# Patient Record
Sex: Male | Born: 1955 | Race: White | Hispanic: No | Marital: Single | Smoking: Former smoker
Health system: Southern US, Community
[De-identification: ages and names within clinical notes are randomized; demographics above are authoritative.]

## PROBLEM LIST (undated history)

## (undated) DIAGNOSIS — M1990 Unspecified osteoarthritis, unspecified site: Secondary | ICD-10-CM

## (undated) DIAGNOSIS — J449 Chronic obstructive pulmonary disease, unspecified: Secondary | ICD-10-CM

## (undated) DIAGNOSIS — M954 Acquired deformity of chest and rib: Secondary | ICD-10-CM

## (undated) HISTORY — PX: FRACTURE SURGERY: SHX138

---

## 2003-06-02 ENCOUNTER — Emergency Department (HOSPITAL_COMMUNITY): Admission: EM | Admit: 2003-06-02 | Discharge: 2003-06-02 | Payer: Self-pay | Admitting: Emergency Medicine

## 2013-01-27 ENCOUNTER — Emergency Department (HOSPITAL_COMMUNITY)
Admission: EM | Admit: 2013-01-27 | Discharge: 2013-01-27 | Disposition: A | Discharge status: Home / Self Care | Payer: Worker's Compensation | Attending: Emergency Medicine | Admitting: Emergency Medicine

## 2013-01-27 ENCOUNTER — Encounter (HOSPITAL_COMMUNITY): Payer: Self-pay

## 2013-01-27 DIAGNOSIS — S0100XA Unspecified open wound of scalp, initial encounter: Secondary | ICD-10-CM | POA: Insufficient documentation

## 2013-01-27 DIAGNOSIS — Z23 Encounter for immunization: Secondary | ICD-10-CM | POA: Insufficient documentation

## 2013-01-27 DIAGNOSIS — F17200 Nicotine dependence, unspecified, uncomplicated: Secondary | ICD-10-CM | POA: Insufficient documentation

## 2013-01-27 DIAGNOSIS — Y9389 Activity, other specified: Secondary | ICD-10-CM | POA: Insufficient documentation

## 2013-01-27 DIAGNOSIS — W11XXXA Fall on and from ladder, initial encounter: Secondary | ICD-10-CM | POA: Insufficient documentation

## 2013-01-27 DIAGNOSIS — S0101XA Laceration without foreign body of scalp, initial encounter: Secondary | ICD-10-CM

## 2013-01-27 DIAGNOSIS — W1809XA Striking against other object with subsequent fall, initial encounter: Secondary | ICD-10-CM | POA: Insufficient documentation

## 2013-01-27 DIAGNOSIS — Y929 Unspecified place or not applicable: Secondary | ICD-10-CM | POA: Insufficient documentation

## 2013-01-27 MED ORDER — TETANUS-DIPHTH-ACELL PERTUSSIS 5-2.5-18.5 LF-MCG/0.5 IM SUSP
0.5000 mL | Freq: Once | INTRAMUSCULAR | Status: AC
  Administered 2013-01-27: 0.5 mL via INTRAMUSCULAR
  Filled 2013-01-27: qty 0.5

## 2013-01-27 MED ORDER — LIDOCAINE-EPINEPHRINE (PF) 1 %-1:200000 IJ SOLN
10.0000 mL | Freq: Once | INTRAMUSCULAR | Status: AC
  Administered 2013-01-27: 10 mL
  Filled 2013-01-27: qty 10

## 2013-01-27 NOTE — ED Notes (Signed)
Clarified with pt. Pt denies LOC but reports "everything went black for a few seconds." EDP reported did not feel the need for CT at this time. Pt and Pt family aware and verbalized understanding.
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
## 2013-01-27 NOTE — ED Notes (Signed)
Pt reports falling backwards off a 6 foot ladder, +headache, no nausea or vomiting. +loc.  Was sent by urgent care. Pt alert in triage.

## 2016-11-24 ENCOUNTER — Emergency Department (HOSPITAL_COMMUNITY): Payer: No Typology Code available for payment source

## 2016-11-24 ENCOUNTER — Emergency Department (HOSPITAL_COMMUNITY)
Admission: EM | Admit: 2016-11-24 | Discharge: 2016-11-25 | Disposition: A | Discharge status: Home / Self Care | Payer: No Typology Code available for payment source | Attending: Emergency Medicine | Admitting: Emergency Medicine

## 2016-11-24 ENCOUNTER — Encounter (HOSPITAL_COMMUNITY): Payer: Self-pay | Admitting: *Deleted

## 2016-11-24 DIAGNOSIS — S62626B Displaced fracture of medial phalanx of right little finger, initial encounter for open fracture: Secondary | ICD-10-CM | POA: Insufficient documentation

## 2016-11-24 DIAGNOSIS — S51811A Laceration without foreign body of right forearm, initial encounter: Secondary | ICD-10-CM | POA: Diagnosis not present

## 2016-11-24 DIAGNOSIS — Y92410 Unspecified street and highway as the place of occurrence of the external cause: Secondary | ICD-10-CM | POA: Diagnosis not present

## 2016-11-24 DIAGNOSIS — S59911A Unspecified injury of right forearm, initial encounter: Secondary | ICD-10-CM | POA: Diagnosis present

## 2016-11-24 DIAGNOSIS — S61210A Laceration without foreign body of right index finger without damage to nail, initial encounter: Secondary | ICD-10-CM | POA: Insufficient documentation

## 2016-11-24 DIAGNOSIS — Y999 Unspecified external cause status: Secondary | ICD-10-CM | POA: Diagnosis not present

## 2016-11-24 DIAGNOSIS — Z87891 Personal history of nicotine dependence: Secondary | ICD-10-CM | POA: Diagnosis not present

## 2016-11-24 DIAGNOSIS — S0031XA Abrasion of nose, initial encounter: Secondary | ICD-10-CM | POA: Insufficient documentation

## 2016-11-24 DIAGNOSIS — S61216A Laceration without foreign body of right little finger without damage to nail, initial encounter: Secondary | ICD-10-CM | POA: Insufficient documentation

## 2016-11-24 DIAGNOSIS — Z23 Encounter for immunization: Secondary | ICD-10-CM | POA: Insufficient documentation

## 2016-11-24 DIAGNOSIS — S61411A Laceration without foreign body of right hand, initial encounter: Secondary | ICD-10-CM

## 2016-11-24 DIAGNOSIS — J449 Chronic obstructive pulmonary disease, unspecified: Secondary | ICD-10-CM | POA: Diagnosis not present

## 2016-11-24 DIAGNOSIS — Y939 Activity, unspecified: Secondary | ICD-10-CM | POA: Insufficient documentation

## 2016-11-24 DIAGNOSIS — R51 Headache: Secondary | ICD-10-CM | POA: Diagnosis not present

## 2016-11-24 DIAGNOSIS — S61219A Laceration without foreign body of unspecified finger without damage to nail, initial encounter: Secondary | ICD-10-CM

## 2016-11-24 HISTORY — DX: Unspecified osteoarthritis, unspecified site: M19.90

## 2016-11-24 HISTORY — DX: Acquired deformity of chest and rib: M95.4

## 2016-11-24 HISTORY — DX: Chronic obstructive pulmonary disease, unspecified: J44.9

## 2016-11-24 LAB — GLUCOSE, CAPILLARY: GLUCOSE-CAPILLARY: 95 mg/dL (ref 65–99)

## 2016-11-24 MED ORDER — ONDANSETRON HCL 4 MG PO TABS
4.0000 mg | ORAL_TABLET | Freq: Once | ORAL | Status: AC
  Administered 2016-11-25: 4 mg via ORAL
  Filled 2016-11-24: qty 1

## 2016-11-24 MED ORDER — HYDROCODONE-ACETAMINOPHEN 5-325 MG PO TABS
2.0000 | ORAL_TABLET | Freq: Once | ORAL | Status: AC
  Administered 2016-11-25: 2 via ORAL
  Filled 2016-11-24: qty 2

## 2016-11-24 MED ORDER — POVIDONE-IODINE 10 % EX SOLN
CUTANEOUS | Status: AC
  Filled 2016-11-24: qty 45

## 2016-11-24 MED ORDER — DOXYCYCLINE HYCLATE 100 MG PO TABS
100.0000 mg | ORAL_TABLET | Freq: Once | ORAL | Status: AC
  Administered 2016-11-25: 100 mg via ORAL
  Filled 2016-11-24: qty 1

## 2016-11-24 MED ORDER — BUPIVACAINE HCL (PF) 0.25 % IJ SOLN
30.0000 mL | Freq: Once | INTRAMUSCULAR | Status: DC
  Filled 2016-11-24: qty 30

## 2016-11-24 MED ORDER — TETANUS-DIPHTH-ACELL PERTUSSIS 5-2.5-18.5 LF-MCG/0.5 IM SUSP
0.5000 mL | Freq: Once | INTRAMUSCULAR | Status: AC
  Administered 2016-11-24: 0.5 mL via INTRAMUSCULAR
  Filled 2016-11-24: qty 0.5

## 2016-11-24 MED ORDER — LORAZEPAM 1 MG PO TABS
1.0000 mg | ORAL_TABLET | Freq: Once | ORAL | Status: AC
  Administered 2016-11-24: 1 mg via ORAL
  Filled 2016-11-24: qty 1

## 2016-11-24 NOTE — ED Notes (Signed)
abd pad and guaze dressing applied to R forearm, no active bleeding noted from large laceration. Gauze dressing applied to R pinky finger, oozing noted to laceration. Pt told to notify staff if bleeding through dressing.

## 2016-11-24 NOTE — ED Notes (Signed)
Pt to CT and chest xray
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
## 2016-11-25 MED ORDER — HYDROCODONE-ACETAMINOPHEN 5-325 MG PO TABS: 1.0000 | ORAL_TABLET | ORAL | 0 refills | Status: DC | PRN

## 2016-11-25 MED ORDER — CIPROFLOXACIN HCL 500 MG PO TABS: 500.0000 mg | ORAL_TABLET | Freq: Two times a day (BID) | ORAL | 0 refills | Status: DC
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
## 2016-12-05 ENCOUNTER — Inpatient Hospital Stay (HOSPITAL_COMMUNITY)
Admission: EM | Admit: 2016-12-05 | Discharge: 2016-12-07 | DRG: 189 | Disposition: A | Discharge status: Home / Self Care | Payer: Self-pay | Attending: Internal Medicine | Admitting: Internal Medicine

## 2016-12-05 ENCOUNTER — Encounter (HOSPITAL_COMMUNITY): Payer: Self-pay

## 2016-12-05 ENCOUNTER — Emergency Department (HOSPITAL_COMMUNITY): Payer: Self-pay

## 2016-12-05 ENCOUNTER — Encounter (HOSPITAL_COMMUNITY): Payer: Self-pay | Admitting: Emergency Medicine

## 2016-12-05 ENCOUNTER — Emergency Department (HOSPITAL_COMMUNITY)
Admission: EM | Admit: 2016-12-05 | Discharge: 2016-12-05 | Disposition: A | Discharge status: Home / Self Care | Payer: Self-pay | Source: Home / Self Care | Attending: Emergency Medicine | Admitting: Emergency Medicine

## 2016-12-05 DIAGNOSIS — R600 Localized edema: Secondary | ICD-10-CM

## 2016-12-05 DIAGNOSIS — S61214D Laceration without foreign body of right ring finger without damage to nail, subsequent encounter: Secondary | ICD-10-CM | POA: Insufficient documentation

## 2016-12-05 DIAGNOSIS — R0902 Hypoxemia: Secondary | ICD-10-CM

## 2016-12-05 DIAGNOSIS — S61216D Laceration without foreign body of right little finger without damage to nail, subsequent encounter: Secondary | ICD-10-CM | POA: Insufficient documentation

## 2016-12-05 DIAGNOSIS — J189 Pneumonia, unspecified organism: Secondary | ICD-10-CM | POA: Diagnosis present

## 2016-12-05 DIAGNOSIS — Y92410 Unspecified street and highway as the place of occurrence of the external cause: Secondary | ICD-10-CM

## 2016-12-05 DIAGNOSIS — J441 Chronic obstructive pulmonary disease with (acute) exacerbation: Secondary | ICD-10-CM | POA: Diagnosis present

## 2016-12-05 DIAGNOSIS — Y906 Blood alcohol level of 120-199 mg/100 ml: Secondary | ICD-10-CM | POA: Diagnosis present

## 2016-12-05 DIAGNOSIS — F10129 Alcohol abuse with intoxication, unspecified: Secondary | ICD-10-CM | POA: Diagnosis present

## 2016-12-05 DIAGNOSIS — J690 Pneumonitis due to inhalation of food and vomit: Secondary | ICD-10-CM | POA: Diagnosis present

## 2016-12-05 DIAGNOSIS — Z4802 Encounter for removal of sutures: Secondary | ICD-10-CM

## 2016-12-05 DIAGNOSIS — S51811D Laceration without foreign body of right forearm, subsequent encounter: Secondary | ICD-10-CM | POA: Insufficient documentation

## 2016-12-05 DIAGNOSIS — Z87891 Personal history of nicotine dependence: Secondary | ICD-10-CM | POA: Insufficient documentation

## 2016-12-05 DIAGNOSIS — R918 Other nonspecific abnormal finding of lung field: Secondary | ICD-10-CM | POA: Diagnosis present

## 2016-12-05 DIAGNOSIS — J9602 Acute respiratory failure with hypercapnia: Secondary | ICD-10-CM | POA: Diagnosis present

## 2016-12-05 DIAGNOSIS — J9601 Acute respiratory failure with hypoxia: Secondary | ICD-10-CM

## 2016-12-05 DIAGNOSIS — S2232XA Fracture of one rib, left side, initial encounter for closed fracture: Secondary | ICD-10-CM | POA: Diagnosis present

## 2016-12-05 DIAGNOSIS — J449 Chronic obstructive pulmonary disease, unspecified: Secondary | ICD-10-CM | POA: Insufficient documentation

## 2016-12-05 DIAGNOSIS — J9622 Acute and chronic respiratory failure with hypercapnia: Principal | ICD-10-CM | POA: Diagnosis present

## 2016-12-05 DIAGNOSIS — J9621 Acute and chronic respiratory failure with hypoxia: Secondary | ICD-10-CM | POA: Diagnosis present

## 2016-12-05 DIAGNOSIS — R911 Solitary pulmonary nodule: Secondary | ICD-10-CM | POA: Diagnosis present

## 2016-12-05 MED ORDER — PREDNISONE 50 MG PO TABS
60.0000 mg | ORAL_TABLET | Freq: Once | ORAL | Status: AC
  Administered 2016-12-06: 60 mg via ORAL
  Filled 2016-12-05: qty 1

## 2016-12-05 MED ORDER — ALBUTEROL (5 MG/ML) CONTINUOUS INHALATION SOLN
10.0000 mg/h | INHALATION_SOLUTION | Freq: Once | RESPIRATORY_TRACT | Status: AC
  Administered 2016-12-05: 10 mg/h via RESPIRATORY_TRACT
  Filled 2016-12-05: qty 20

## 2016-12-05 MED ORDER — ALBUTEROL (5 MG/ML) CONTINUOUS INHALATION SOLN
10.0000 mg/h | INHALATION_SOLUTION | Freq: Once | RESPIRATORY_TRACT | Status: AC
  Administered 2016-12-06: 10 mg/h via RESPIRATORY_TRACT
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
## 2016-12-05 NOTE — ED Triage Notes (Signed)
Rt forearm staple removal

## 2016-12-05 NOTE — Discharge Instructions (Signed)
Keep the finger splinted.  You can clean the wounds with mild soap and water.  Call Dr. Mort SawyersHarrison's office to arrange a follow-up

## 2016-12-06 ENCOUNTER — Emergency Department (HOSPITAL_COMMUNITY): Payer: Self-pay

## 2016-12-06 ENCOUNTER — Encounter (HOSPITAL_COMMUNITY): Payer: Self-pay | Admitting: Family Medicine

## 2016-12-06 DIAGNOSIS — F10129 Alcohol abuse with intoxication, unspecified: Secondary | ICD-10-CM | POA: Diagnosis present

## 2016-12-06 DIAGNOSIS — J189 Pneumonia, unspecified organism: Secondary | ICD-10-CM

## 2016-12-06 DIAGNOSIS — R918 Other nonspecific abnormal finding of lung field: Secondary | ICD-10-CM | POA: Diagnosis present

## 2016-12-06 DIAGNOSIS — J9601 Acute respiratory failure with hypoxia: Secondary | ICD-10-CM | POA: Insufficient documentation

## 2016-12-06 DIAGNOSIS — J441 Chronic obstructive pulmonary disease with (acute) exacerbation: Secondary | ICD-10-CM | POA: Diagnosis present

## 2016-12-06 DIAGNOSIS — J9602 Acute respiratory failure with hypercapnia: Secondary | ICD-10-CM | POA: Diagnosis present

## 2016-12-06 LAB — BLOOD GAS, ARTERIAL
Acid-Base Excess: 2.6 mmol/L — ABNORMAL HIGH (ref 0.0–2.0)
Acid-Base Excess: 5 mmol/L — ABNORMAL HIGH (ref 0.0–2.0)
BICARBONATE: 28 mmol/L (ref 20.0–28.0)
Bicarbonate: 24.8 mmol/L (ref 20.0–28.0)
DELIVERY SYSTEMS: POSITIVE
DRAWN BY: 382351
DRAWN BY: 28459
EXPIRATORY PAP: 6
FIO2: 100
FIO2: 45
Inspiratory PAP: 12
O2 Saturation: 98.5 %
O2 Saturation: 97.9 %
PATIENT TEMPERATURE: 37
PATIENT TEMPERATURE: 37
PH ART: 7.219 — AB (ref 7.350–7.450)
PO2 ART: 174 mmHg — AB (ref 83.0–108.0)
RATE: 8 resp/min
pCO2 arterial: 75.5 mmHg (ref 32.0–48.0)
pCO2 arterial: 53.7 mmHg — ABNORMAL HIGH (ref 32.0–48.0)
pH, Arterial: 7.367 (ref 7.350–7.450)
pO2, Arterial: 118 mmHg — ABNORMAL HIGH (ref 83.0–108.0)

## 2016-12-06 LAB — CBC WITH DIFFERENTIAL/PLATELET
BASOS ABS: 0.1 10*3/uL (ref 0.0–0.1)
Basophils Absolute: 0 10*3/uL (ref 0.0–0.1)
Basophils Relative: 0 %
Basophils Relative: 1 %
EOS PCT: 0 %
Eosinophils Absolute: 0 10*3/uL (ref 0.0–0.7)
Eosinophils Absolute: 0.6 10*3/uL (ref 0.0–0.7)
Eosinophils Relative: 7 %
HCT: 39 % (ref 39.0–52.0)
HEMATOCRIT: 39.7 % (ref 39.0–52.0)
HEMOGLOBIN: 12.9 g/dL — AB (ref 13.0–17.0)
Hemoglobin: 12.7 g/dL — ABNORMAL LOW (ref 13.0–17.0)
LYMPHS ABS: 3.1 10*3/uL (ref 0.7–4.0)
LYMPHS PCT: 32 %
Lymphocytes Relative: 5 %
Lymphs Abs: 0.8 10*3/uL (ref 0.7–4.0)
MCH: 29.8 pg (ref 26.0–34.0)
MCH: 30.6 pg (ref 26.0–34.0)
MCHC: 32 g/dL (ref 30.0–36.0)
MCHC: 33.1 g/dL (ref 30.0–36.0)
MCV: 92.6 fL (ref 78.0–100.0)
MCV: 93.2 fL (ref 78.0–100.0)
MONO ABS: 1.7 10*3/uL — AB (ref 0.1–1.0)
MONOS PCT: 10 %
Monocytes Absolute: 0.7 10*3/uL (ref 0.1–1.0)
Monocytes Relative: 7 %
NEUTROS PCT: 53 %
Neutro Abs: 14 10*3/uL — ABNORMAL HIGH (ref 1.7–7.7)
Neutro Abs: 5.2 10*3/uL (ref 1.7–7.7)
Neutrophils Relative %: 85 %
PLATELETS: 332 10*3/uL (ref 150–400)
Platelets: 333 10*3/uL (ref 150–400)
RBC: 4.21 MIL/uL — AB (ref 4.22–5.81)
RBC: 4.26 MIL/uL (ref 4.22–5.81)
RDW: 12.9 % (ref 11.5–15.5)
RDW: 13.1 % (ref 11.5–15.5)
WBC: 16.5 10*3/uL — ABNORMAL HIGH (ref 4.0–10.5)
WBC: 9.6 10*3/uL (ref 4.0–10.5)

## 2016-12-06 LAB — BASIC METABOLIC PANEL
ANION GAP: 11 (ref 5–15)
Anion gap: 9 (ref 5–15)
BUN: 12 mg/dL (ref 6–20)
BUN: 16 mg/dL (ref 6–20)
CALCIUM: 8.9 mg/dL (ref 8.9–10.3)
CHLORIDE: 100 mmol/L — AB (ref 101–111)
CHLORIDE: 98 mmol/L — AB (ref 101–111)
CO2: 28 mmol/L (ref 22–32)
CO2: 29 mmol/L (ref 22–32)
CREATININE: 0.92 mg/dL (ref 0.61–1.24)
Calcium: 8.7 mg/dL — ABNORMAL LOW (ref 8.9–10.3)
Creatinine, Ser: 0.81 mg/dL (ref 0.61–1.24)
GFR calc non Af Amer: >60 mL/min (ref >60)
GLUCOSE: 167 mg/dL — AB (ref 65–99)
Glucose, Bld: 105 mg/dL — ABNORMAL HIGH (ref 65–99)
POTASSIUM: 3.7 mmol/L (ref 3.5–5.1)
Potassium: 4.1 mmol/L (ref 3.5–5.1)
SODIUM: 137 mmol/L (ref 135–145)
Sodium: 138 mmol/L (ref 135–145)

## 2016-12-06 LAB — TROPONIN I: Troponin I: <0.03 ng/mL (ref <0.03)

## 2016-12-06 LAB — MRSA PCR SCREENING: MRSA by PCR: NEGATIVE

## 2016-12-06 LAB — PROCALCITONIN: PROCALCITONIN: 0.89 ng/mL

## 2016-12-06 LAB — ETHANOL: Alcohol, Ethyl (B): 144 mg/dL — ABNORMAL HIGH (ref <5)

## 2016-12-06 MED ORDER — IPRATROPIUM-ALBUTEROL 0.5-2.5 (3) MG/3ML IN SOLN
3.0000 mL | Freq: Four times a day (QID) | RESPIRATORY_TRACT | Status: DC
  Administered 2016-12-06 (×3): 3 mL via RESPIRATORY_TRACT
  Filled 2016-12-06 (×2): qty 3

## 2016-12-06 MED ORDER — FOLIC ACID 1 MG PO TABS
1.0000 mg | ORAL_TABLET | Freq: Every day | ORAL | Status: DC
  Administered 2016-12-06 – 2016-12-07 (×2): 1 mg via ORAL
  Filled 2016-12-06 (×2): qty 1

## 2016-12-06 MED ORDER — METHYLPREDNISOLONE SODIUM SUCC 125 MG IJ SOLR
80.0000 mg | Freq: Once | INTRAMUSCULAR | Status: AC
  Administered 2016-12-06: 80 mg via INTRAVENOUS
  Filled 2016-12-06: qty 2

## 2016-12-06 MED ORDER — MAGNESIUM SULFATE 2 GM/50ML IV SOLN
2.0000 g | Freq: Once | INTRAVENOUS | Status: AC
  Administered 2016-12-06: 2 g via INTRAVENOUS
  Filled 2016-12-06 (×2): qty 50

## 2016-12-06 MED ORDER — ADULT MULTIVITAMIN W/MINERALS CH
1.0000 | ORAL_TABLET | Freq: Every day | ORAL | Status: DC
  Administered 2016-12-06 – 2016-12-07 (×2): 1 via ORAL
  Filled 2016-12-06 (×2): qty 1

## 2016-12-06 MED ORDER — M.V.I. ADULT IV INJ
INJECTION | INTRAVENOUS | Status: AC
  Filled 2016-12-06: qty 10

## 2016-12-06 MED ORDER — METHYLPREDNISOLONE SODIUM SUCC 125 MG IJ SOLR
80.0000 mg | Freq: Two times a day (BID) | INTRAMUSCULAR | Status: DC
  Administered 2016-12-06 – 2016-12-07 (×2): 80 mg via INTRAVENOUS
  Filled 2016-12-06 (×2): qty 2

## 2016-12-06 MED ORDER — SODIUM CHLORIDE 0.9 % IV SOLN
3.0000 g | Freq: Three times a day (TID) | INTRAVENOUS | Status: DC
  Administered 2016-12-06 – 2016-12-07 (×4): 3 g via INTRAVENOUS
  Filled 2016-12-06 (×10): qty 3

## 2016-12-06 MED ORDER — FOLIC ACID 5 MG/ML IJ SOLN
INTRAMUSCULAR | Status: AC
  Filled 2016-12-06: qty 0.2

## 2016-12-06 MED ORDER — ALBUTEROL SULFATE (2.5 MG/3ML) 0.083% IN NEBU: 2.5000 mg | INHALATION_SOLUTION | RESPIRATORY_TRACT | Status: DC | PRN

## 2016-12-06 MED ORDER — LEVOFLOXACIN IN D5W 750 MG/150ML IV SOLN
750.0000 mg | INTRAVENOUS | Status: DC
  Administered 2016-12-07: 750 mg via INTRAVENOUS
  Filled 2016-12-06: qty 150

## 2016-12-06 MED ORDER — LORAZEPAM 1 MG PO TABS
0.0000 mg | ORAL_TABLET | Freq: Four times a day (QID) | ORAL | Status: DC
  Administered 2016-12-07 (×2): 1 mg via ORAL
  Filled 2016-12-06 (×4): qty 1

## 2016-12-06 MED ORDER — IOPAMIDOL (ISOVUE-300) INJECTION 61%
100.0000 mL | Freq: Once | INTRAVENOUS | Status: AC | PRN
  Administered 2016-12-06: 100 mL via INTRAVENOUS

## 2016-12-06 MED ORDER — THIAMINE HCL 100 MG/ML IJ SOLN
INTRAMUSCULAR | Status: AC
  Filled 2016-12-06: qty 2

## 2016-12-06 MED ORDER — THIAMINE HCL 100 MG/ML IJ SOLN: 100.0000 mg | Freq: Every day | INTRAMUSCULAR | Status: DC

## 2016-12-06 MED ORDER — VITAMIN B-1 100 MG PO TABS
100.0000 mg | ORAL_TABLET | Freq: Every day | ORAL | Status: DC
  Administered 2016-12-06 – 2016-12-07 (×2): 100 mg via ORAL
  Filled 2016-12-06 (×2): qty 1

## 2016-12-06 MED ORDER — THIAMINE HCL 100 MG/ML IJ SOLN
Freq: Once | INTRAVENOUS | Status: AC
  Administered 2016-12-06: 06:00:00 via INTRAVENOUS
  Filled 2016-12-06: qty 1000

## 2016-12-06 MED ORDER — IPRATROPIUM-ALBUTEROL 0.5-2.5 (3) MG/3ML IN SOLN
3.0000 mL | Freq: Three times a day (TID) | RESPIRATORY_TRACT | Status: DC
  Administered 2016-12-07: 3 mL via RESPIRATORY_TRACT
  Filled 2016-12-06: qty 3

## 2016-12-06 MED ORDER — LORAZEPAM 1 MG PO TABS
1.0000 mg | ORAL_TABLET | Freq: Four times a day (QID) | ORAL | Status: DC | PRN
  Administered 2016-12-06 (×2): 1 mg via ORAL

## 2016-12-06 MED ORDER — LORAZEPAM 1 MG PO TABS
0.0000 mg | ORAL_TABLET | Freq: Two times a day (BID) | ORAL | Status: DC
Start: 2016-12-08 — End: 2016-12-07

## 2016-12-06 MED ORDER — LORAZEPAM 2 MG/ML IJ SOLN: 1.0000 mg | Freq: Four times a day (QID) | INTRAMUSCULAR | Status: DC | PRN

## 2016-12-06 MED ORDER — LEVOFLOXACIN IN D5W 750 MG/150ML IV SOLN
750.0000 mg | Freq: Once | INTRAVENOUS | Status: AC
  Administered 2016-12-06: 750 mg via INTRAVENOUS
  Filled 2016-12-06: qty 150

## 2016-12-06 NOTE — ED Notes (Signed)
Report to James, RN ICU 
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
## 2016-12-06 NOTE — Plan of Care (Signed)
Problem: Tissue Perfusion: Goal: Risk factors for ineffective tissue perfusion will decrease Outcome: Progressing SCDs applied for DVT prevention

## 2016-12-06 NOTE — Progress Notes (Signed)
Attempted to place patient on BIPAP. Unable to get adequate seal with mask because of patient's beard. Bipap has too high of a leak and continues to cycle off. Informed RN and spoke with doctor. Will see what ABG results are and go from there.
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
## 2016-12-06 NOTE — Progress Notes (Signed)
Patient's beard shaved down and brought a different machine to put patient on. Patient started on BIPAP at 2:25.

## 2016-12-06 NOTE — ED Notes (Signed)
Attempted to call family  No answer left voicemail
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
## 2016-12-06 NOTE — ED Notes (Signed)
Attempted to call family to come pick up clothes no answer left voicemail
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
## 2016-12-07 DIAGNOSIS — F10129 Alcohol abuse with intoxication, unspecified: Secondary | ICD-10-CM

## 2016-12-07 DIAGNOSIS — R918 Other nonspecific abnormal finding of lung field: Secondary | ICD-10-CM

## 2016-12-07 DIAGNOSIS — J441 Chronic obstructive pulmonary disease with (acute) exacerbation: Secondary | ICD-10-CM

## 2016-12-07 DIAGNOSIS — J9601 Acute respiratory failure with hypoxia: Secondary | ICD-10-CM

## 2016-12-07 LAB — HIV ANTIBODY (ROUTINE TESTING W REFLEX): HIV SCREEN 4TH GENERATION: NONREACTIVE

## 2016-12-07 LAB — STREP PNEUMONIAE URINARY ANTIGEN: STREP PNEUMO URINARY ANTIGEN: NEGATIVE

## 2016-12-07 MED ORDER — THIAMINE HCL 100 MG PO TABS: 100.0000 mg | ORAL_TABLET | Freq: Every day | ORAL | Status: AC

## 2016-12-07 MED ORDER — FOLIC ACID 1 MG PO TABS: 1.0000 mg | ORAL_TABLET | Freq: Every day | ORAL | Status: AC

## 2016-12-07 MED ORDER — AMOXICILLIN-POT CLAVULANATE 875-125 MG PO TABS: 1.0000 | ORAL_TABLET | Freq: Two times a day (BID) | ORAL | 0 refills | Status: AC

## 2016-12-07 MED ORDER — PREDNISONE 10 MG PO TABS: 10.0000 mg | ORAL_TABLET | Freq: Every day | ORAL | 0 refills | Status: AC

## 2016-12-07 MED ORDER — ADULT MULTIVITAMIN W/MINERALS CH: 1.0000 | ORAL_TABLET | Freq: Every day | ORAL | Status: AC
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
## 2016-12-07 NOTE — Discharge Summary (Signed)
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 3 results) No results for input(s): BNP in the last 8760 hours.  ProBNP (last 3 results) No results for input(s): PROBNP in the last 8760 hours.  CBG: No results for input(s): GLUCAP in the last 168 hours.     SignedChaya Jan  [facility] Pager: [phone] 12/07/2016, 9:40 AM

## 2016-12-07 NOTE — Progress Notes (Signed)
ICU PRINTERS ARE DOWN.UNABLE TO PRINT OUT PT'S  DISCHARGE INSTRUCTION. INSTRUCTIONS GIVEN VERBALLY ALONG W/ PRESCRIPTIONS. PAPERS TO BE MAILED TO PT. PT ALERT AND ORIENTED. VVS. NO RESPIRATORY DISTRESS. NO C/O PAIN. AMBULATING W/O DOFFICULTY. PT'S BELONGINGS RRETURNED TO HIM. FAMILY HAS COME TO TAKE PT HOME.

## 2016-12-11 LAB — CULTURE, BLOOD (ROUTINE X 2)
Culture: NO GROWTH
Culture: NO GROWTH

## 2017-05-30 DEATH — deceased
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
